# Patient Record
Sex: Male | Born: 1995 | Race: White | Hispanic: No | Marital: Single | Smoking: Never smoker
Health system: Southern US, Community
[De-identification: ages and names within clinical notes are randomized; demographics above are authoritative.]

## PROBLEM LIST (undated history)

## (undated) HISTORY — PX: TOENAIL EXCISION: SUR558

---
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
## 2017-03-17 ENCOUNTER — Encounter: Payer: Self-pay | Admitting: Emergency Medicine

## 2017-03-17 ENCOUNTER — Emergency Department (INDEPENDENT_AMBULATORY_CARE_PROVIDER_SITE_OTHER)

## 2017-03-17 ENCOUNTER — Emergency Department (INDEPENDENT_AMBULATORY_CARE_PROVIDER_SITE_OTHER)
Admission: EM | Admit: 2017-03-17 | Discharge: 2017-03-17 | Disposition: A | Discharge status: Home / Self Care | Source: Home / Self Care | Attending: Emergency Medicine | Admitting: Emergency Medicine

## 2017-03-17 DIAGNOSIS — S43412A Sprain of left coracohumeral (ligament), initial encounter: Secondary | ICD-10-CM

## 2017-03-17 DIAGNOSIS — S40012A Contusion of left shoulder, initial encounter: Secondary | ICD-10-CM

## 2017-03-17 DIAGNOSIS — M25512 Pain in left shoulder: Secondary | ICD-10-CM | POA: Diagnosis not present

## 2017-03-17 MED ORDER — IBUPROFEN 400 MG PO TABS
400.0000 mg | ORAL_TABLET | Freq: Once | ORAL | Status: AC
  Administered 2017-03-17: 400 mg via ORAL

## 2017-03-17 MED ORDER — IBUPROFEN 200 MG PO TABS: ORAL_TABLET | ORAL | 0 refills | Status: AC

## 2017-03-17 NOTE — Discharge Instructions (Signed)
X-ray left shoulder shows no fracture or dislocation. Wear L sling. Apply ice for next 24 hours. OTC ibuprofen, take 3 about every 8 hours with food as needed for pain. Follow-up with your family doctor or orthopedist if no better in one week, sooner if worse or new symptoms.

## 2017-03-17 NOTE — ED Notes (Signed)
Applied shoulder immobilizer and gave ice pack .
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
## 2018-05-29 ENCOUNTER — Emergency Department (INDEPENDENT_AMBULATORY_CARE_PROVIDER_SITE_OTHER)
Admission: EM | Admit: 2018-05-29 | Discharge: 2018-05-29 | Disposition: A | Discharge status: Home / Self Care | Source: Home / Self Care | Attending: Family Medicine | Admitting: Family Medicine

## 2018-05-29 ENCOUNTER — Other Ambulatory Visit: Payer: Self-pay

## 2018-05-29 DIAGNOSIS — J0100 Acute maxillary sinusitis, unspecified: Secondary | ICD-10-CM

## 2018-05-29 DIAGNOSIS — H6982 Other specified disorders of Eustachian tube, left ear: Secondary | ICD-10-CM

## 2018-05-29 MED ORDER — AMOXICILLIN 875 MG PO TABS: 875.0000 mg | ORAL_TABLET | Freq: Two times a day (BID) | ORAL | 0 refills | Status: DC

## 2018-05-29 MED ORDER — PREDNISONE 20 MG PO TABS: ORAL_TABLET | ORAL | 0 refills | Status: AC

## 2018-05-29 MED ORDER — AMOXICILLIN 875 MG PO TABS: 875.0000 mg | ORAL_TABLET | Freq: Two times a day (BID) | ORAL | 0 refills | Status: AC

## 2018-05-29 MED ORDER — PREDNISONE 20 MG PO TABS: ORAL_TABLET | ORAL | 0 refills | Status: DC

## 2018-05-29 NOTE — ED Provider Notes (Signed)
[facility]    CSN: [id number] Arrival date & time: 05/29/18  1334     History   Chief Complaint Chief Complaint  Patient presents with  . Otalgia    left    HPI Isaac Oconnell is a 23 y.o. male.   Patient reports that he has had a mild cough for about a month.  1.5 weeks [place], he developed a mild sore throat and his ears began to feel clogged.  His left ear has persistently "throbbed" and he complains of facial pressure.  He has a history of recurrent sinusitis.  The history is provided by the patient.    History reviewed. No pertinent past medical history.  Patient Active Problem List   Diagnosis Date Noted  . ALLERGIC RHINITIS 10/13/2009  . THUMB SPRAIN 10/13/2009    Past Surgical History:  Procedure Laterality Date  . TOENAIL EXCISION         Home Medications    Prior to Admission medications   Medication Sig Start Date End Date Taking? Authorizing Provider  amoxicillin (AMOXIL) 875 MG tablet Take 1 tablet (875 mg total) by mouth 2 (two) times daily. 05/29/18   Lattie HawBeese, Kazi Montoro A, MD  ibuprofen (ADVIL,MOTRIN) 200 MG tablet Take three tablets ( 600 milligrams total) every 6 with food as needed for pain. 03/17/17   Lajean ManesMassey, David, MD  predniSONE (DELTASONE) 20 MG tablet Take one tab by mouth twice daily for 5 days, then one daily. Take with food. 05/29/18   Lattie HawBeese, Landyn Buckalew A, MD    Family History Family History  Problem Relation Age of Onset  . Cancer Father        father    Social History Social History   Tobacco Use  . Smoking status: Never Smoker  . Smokeless tobacco: Never Used  Substance Use Topics  . Alcohol use: No  . Drug use: No     Allergies   Patient has no known allergies.   Review of Systems Review of Systems + sore throat + cough No pleuritic pain No wheezing + nasal congestion + post-nasal drainage + sinus pain/pressure No itchy/red eyes ? earache No hemoptysis No SOB No  fever/chills No nausea No vomiting No abdominal pain No diarrhea No urinary symptoms No skin rash + fatigue No myalgias No headache Used OTC meds without relief   Physical Exam Triage Vital Signs ED Triage Vitals [05/29/18 1353]  Enc Vitals Group     BP 137/88     Pulse Rate 81     Resp 18     Temp 98.3 F (36.8 C)     Temp Source Oral     SpO2 97 %     Weight (!) 303 lb (137.4 kg)     Height 6\' 1"  (1.854 m)     Head Circumference      Peak Flow      Pain Score 4     Pain Loc      Pain Edu?      Excl. in GC?    No data found.  Updated Vital Signs BP 137/88 (BP Location: Right Arm)   Pulse 81   Temp 98.3 F (36.8 C) (Oral)   Resp 18   Ht 6\' 1"  (1.854 m)   Wt (!) 137.4 kg   SpO2 97%   BMI 39.98 kg/m   Visual Acuity Right Eye Distance:   Left Eye Distance:   Bilateral Distance:  Right Eye Near:   Left Eye Near:    Bilateral Near:     Physical Exam Nursing notes and Vital Signs reviewed. Appearance:  Patient appears stated age, and in no acute distress Eyes:  Pupils are equal, round, and reactive to light and accomodation.  Extraocular movement is intact.  Conjunctivae are not inflamed  Ears: Right canal and tympanic membrane normal.  Left canal partly occluded with cerumen; visualized portion of left tympanic membrane appears normal. Nose:  Congested turbinates.  Maxillary sinus tenderness is present.  Pharynx:  Normal Neck:  Supple.  No adenopathy. Lungs:  Clear to auscultation.  Breath sounds are equal.  Moving air well. Heart:  Regular rate and rhythm without murmurs, rubs, or gallops.  Abdomen:  Nontender without masses or hepatosplenomegaly.  Bowel sounds are present.  No CVA or flank tenderness.  Extremities:  No edema.  Skin:  No rash present.    UC Treatments / Results  Labs (all labs ordered are listed, but only abnormal results are displayed) Labs Reviewed -   Tympanometry:  Right ear tympanogram normal; Left ear tympanogram negative  peak pressure  EKG None  Radiology No results found.  Procedures Procedures (including critical care time)  Medications Ordered in UC Medications - No data to display  Initial Impression / Assessment and Plan / UC Course  I have reviewed the triage vital signs and the nursing notes.  Pertinent labs & imaging results that were available during my care of the patient were reviewed by me and considered in my medical decision making (see chart for details).    Begin amoxicillin and prednisone burst/taper. Followup with ENT if not improved 10 days.   Final Clinical Impressions(s) / UC Diagnoses   Final diagnoses:  Subacute maxillary sinusitis  Eustachian tube dysfunction, left     Discharge Instructions     Try Pseudoephedrine (30mg , one or two every 4 to 6 hours) for sinus congestion.    May use Afrin nasal spray (or generic oxymetazoline) each morning for about 5 days and then discontinue.  Also recommend using saline nasal spray several times daily and saline nasal irrigation (AYR is a common brand).  Use Flonase nasal spray each morning after using Afrin nasal spray and saline nasal irrigation.      ED Prescriptions    Medication Sig Dispense Auth. Provider   amoxicillin (AMOXIL) 875 MG tablet Take 1 tablet (875 mg total) by mouth 2 (two) times daily. 20 tablet Lattie Haw, MD   predniSONE (DELTASONE) 20 MG tablet Take one tab by mouth twice daily for 5 days, then one daily. Take with food. 14 tablet Lattie Haw, MD        Lattie Haw, MD 05/30/18 [phone]

## 2018-05-29 NOTE — Discharge Instructions (Addendum)
Try Pseudoephedrine (30mg , one or two every 4 to 6 hours) for sinus congestion.    May use Afrin nasal spray (or generic oxymetazoline) each morning for about 5 days and then discontinue.  Also recommend using saline nasal spray several times daily and saline nasal irrigation (AYR is a common brand).  Use Flonase nasal spray each morning after using Afrin nasal spray and saline nasal irrigation.
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
